# Patient Record
Sex: Male | Born: 1975 | Race: White | Hispanic: No | Marital: Single | Smoking: Current every day smoker
Health system: Southern US, Community
[De-identification: ages and names within clinical notes are randomized; demographics above are authoritative.]

## PROBLEM LIST (undated history)

## (undated) DIAGNOSIS — K219 Gastro-esophageal reflux disease without esophagitis: Secondary | ICD-10-CM

## (undated) DIAGNOSIS — F329 Major depressive disorder, single episode, unspecified: Secondary | ICD-10-CM

## (undated) DIAGNOSIS — F319 Bipolar disorder, unspecified: Secondary | ICD-10-CM

## (undated) DIAGNOSIS — Z87820 Personal history of traumatic brain injury: Secondary | ICD-10-CM

## (undated) DIAGNOSIS — F419 Anxiety disorder, unspecified: Secondary | ICD-10-CM

## (undated) DIAGNOSIS — F32A Depression, unspecified: Secondary | ICD-10-CM

## (undated) DIAGNOSIS — Z973 Presence of spectacles and contact lenses: Secondary | ICD-10-CM

## (undated) DIAGNOSIS — N201 Calculus of ureter: Secondary | ICD-10-CM

## (undated) DIAGNOSIS — R413 Other amnesia: Secondary | ICD-10-CM

## (undated) DIAGNOSIS — R3915 Urgency of urination: Secondary | ICD-10-CM

## (undated) HISTORY — PX: APPENDECTOMY: SHX54

---

## 2016-04-28 ENCOUNTER — Encounter (HOSPITAL_COMMUNITY): Payer: Self-pay

## 2016-04-28 ENCOUNTER — Emergency Department (HOSPITAL_COMMUNITY)
Admission: EM | Admit: 2016-04-28 | Discharge: 2016-04-28 | Disposition: A | Discharge status: Home / Self Care | Payer: Self-pay | Attending: Emergency Medicine | Admitting: Emergency Medicine

## 2016-04-28 ENCOUNTER — Emergency Department (HOSPITAL_COMMUNITY): Payer: Self-pay

## 2016-04-28 DIAGNOSIS — F17200 Nicotine dependence, unspecified, uncomplicated: Secondary | ICD-10-CM | POA: Insufficient documentation

## 2016-04-28 DIAGNOSIS — N132 Hydronephrosis with renal and ureteral calculous obstruction: Secondary | ICD-10-CM | POA: Insufficient documentation

## 2016-04-28 DIAGNOSIS — N23 Unspecified renal colic: Secondary | ICD-10-CM

## 2016-04-28 DIAGNOSIS — N200 Calculus of kidney: Secondary | ICD-10-CM

## 2016-04-28 LAB — URINALYSIS, ROUTINE W REFLEX MICROSCOPIC
BACTERIA UA: NONE SEEN
BILIRUBIN URINE: NEGATIVE
Glucose, UA: NEGATIVE mg/dL
HGB URINE DIPSTICK: NEGATIVE
KETONES UR: NEGATIVE mg/dL
Leukocytes, UA: NEGATIVE
NITRITE: NEGATIVE
PROTEIN: 30 mg/dL — AB
Specific Gravity, Urine: 1.017 (ref 1.005–1.030)
Squamous Epithelial / LPF: NONE SEEN
pH: 9 — ABNORMAL HIGH (ref 5.0–8.0)

## 2016-04-28 LAB — COMPREHENSIVE METABOLIC PANEL
ALBUMIN: 4.3 g/dL (ref 3.5–5.0)
ALK PHOS: 71 U/L (ref 38–126)
ALT: 39 U/L (ref 17–63)
ANION GAP: 14 (ref 5–15)
AST: 42 U/L — ABNORMAL HIGH (ref 15–41)
BUN: 13 mg/dL (ref 6–20)
CALCIUM: 9.9 mg/dL (ref 8.9–10.3)
CO2: 21 mmol/L — AB (ref 22–32)
Chloride: 103 mmol/L (ref 101–111)
Creatinine, Ser: 1.11 mg/dL (ref 0.61–1.24)
GFR calc non Af Amer: >60 mL/min (ref >60)
GLUCOSE: 122 mg/dL — AB (ref 65–99)
POTASSIUM: 4.9 mmol/L (ref 3.5–5.1)
SODIUM: 138 mmol/L (ref 135–145)
TOTAL PROTEIN: 7.7 g/dL (ref 6.5–8.1)
Total Bilirubin: 0.8 mg/dL (ref 0.3–1.2)

## 2016-04-28 LAB — CBC
HEMATOCRIT: 44.1 % (ref 39.0–52.0)
HEMOGLOBIN: 15.2 g/dL (ref 13.0–17.0)
MCH: 31.7 pg (ref 26.0–34.0)
MCHC: 34.5 g/dL (ref 30.0–36.0)
MCV: 91.9 fL (ref 78.0–100.0)
Platelets: 309 10*3/uL (ref 150–400)
RBC: 4.8 MIL/uL (ref 4.22–5.81)
RDW: 13.1 % (ref 11.5–15.5)
WBC: 9.8 10*3/uL (ref 4.0–10.5)

## 2016-04-28 LAB — LIPASE, BLOOD: Lipase: 27 U/L (ref 11–51)

## 2016-04-28 MED ORDER — HYDROMORPHONE HCL 1 MG/ML IJ SOLN
1.0000 mg | Freq: Once | INTRAMUSCULAR | Status: AC
  Administered 2016-04-28: 1 mg via INTRAVENOUS
  Filled 2016-04-28: qty 1

## 2016-04-28 MED ORDER — TAMSULOSIN HCL 0.4 MG PO CAPS
0.4000 mg | ORAL_CAPSULE | Freq: Two times a day (BID) | ORAL | 0 refills | Status: DC
Start: 2016-04-28 — End: 2016-05-16

## 2016-04-28 MED ORDER — FENTANYL CITRATE (PF) 100 MCG/2ML IJ SOLN
100.0000 ug | Freq: Once | INTRAMUSCULAR | Status: AC
Start: 2016-04-28 — End: 2016-04-28
  Administered 2016-04-28: 100 ug via INTRAVENOUS
  Filled 2016-04-28: qty 2

## 2016-04-28 MED ORDER — ONDANSETRON 4 MG PO TBDP
4.0000 mg | ORAL_TABLET | Freq: Once | ORAL | Status: AC | PRN
  Administered 2016-04-28: 4 mg via ORAL

## 2016-04-28 MED ORDER — TAMSULOSIN HCL 0.4 MG PO CAPS
0.4000 mg | ORAL_CAPSULE | Freq: Once | ORAL | Status: AC
  Administered 2016-04-28: 0.4 mg via ORAL
  Filled 2016-04-28: qty 1

## 2016-04-28 MED ORDER — ONDANSETRON HCL 4 MG/2ML IJ SOLN
4.0000 mg | Freq: Once | INTRAMUSCULAR | Status: AC | PRN
  Administered 2016-04-28: 4 mg via INTRAVENOUS
  Filled 2016-04-28: qty 2

## 2016-04-28 MED ORDER — ONDANSETRON HCL 4 MG PO TABS: 4.0000 mg | ORAL_TABLET | Freq: Four times a day (QID) | ORAL | 0 refills | Status: AC | PRN

## 2016-04-28 MED ORDER — OXYCODONE-ACETAMINOPHEN 5-325 MG PO TABS
1.0000 | ORAL_TABLET | Freq: Once | ORAL | Status: AC
  Administered 2016-04-28: 1 via ORAL

## 2016-04-28 MED ORDER — ONDANSETRON 4 MG PO TBDP
ORAL_TABLET | ORAL | Status: AC
  Filled 2016-04-28: qty 1

## 2016-04-28 MED ORDER — KETOROLAC TROMETHAMINE 30 MG/ML IJ SOLN
30.0000 mg | Freq: Once | INTRAMUSCULAR | Status: AC
  Administered 2016-04-28: 30 mg via INTRAVENOUS
  Filled 2016-04-28: qty 1

## 2016-04-28 MED ORDER — KETOROLAC TROMETHAMINE 10 MG PO TABS: 10.0000 mg | ORAL_TABLET | Freq: Four times a day (QID) | ORAL | 0 refills | Status: DC | PRN

## 2016-04-28 MED ORDER — KETOROLAC TROMETHAMINE 10 MG PO TABS
10.0000 mg | ORAL_TABLET | Freq: Once | ORAL | Status: AC
  Administered 2016-04-28: 10 mg via ORAL
  Filled 2016-04-28: qty 1

## 2016-04-28 MED ORDER — OXYCODONE-ACETAMINOPHEN 5-325 MG PO TABS
1.0000 | ORAL_TABLET | ORAL | 0 refills | Status: DC | PRN
Start: 2016-04-28 — End: 2016-05-16

## 2016-04-28 MED ORDER — OXYCODONE-ACETAMINOPHEN 5-325 MG PO TABS
ORAL_TABLET | ORAL | Status: AC
  Filled 2016-04-28: qty 1

## 2016-04-28 NOTE — ED Notes (Signed)
IV removed.

## 2016-04-28 NOTE — ED Triage Notes (Signed)
Patient complains of sudden onset of LLQ pain radiating to flank for 45 minutes prior to arrival with nausea. Patient restless and hyperventilating on arrival. No vomiting, no diarrhea. Alert and oriented

## 2016-04-28 NOTE — ED Notes (Signed)
Pt states he understands instructions. Home stable with steady gait with wife. 

## 2016-04-28 NOTE — ED Provider Notes (Signed)
[facility]-EMERGENCY DEPT Provider Note   CSN: [id number] Arrival date & time: 04/28/16  0705     History   Chief Complaint Chief Complaint  Patient presents with  . Abdominal Pain  . Flank Pain    HPI KHADEEM ROCKETT is a 41 y.o. male with no significant past medical history who presents emergency Department with chief complaint of left flank pain. Patient states that he awoke around 4:30 in the morning with left flank pain. Pain is described as colicky, "the worst pain of ever had in my life." He states that he has a constant resting pain which is severe, however, he has bouts of intolerable left flank pain which she describes as a severe cramp, radiating from the left part of his back all the way around to the left lower quadrant of his abdomen. He denies any testicular pain. He did have increased urinary frequency yesterday but denies hematuria. He has no previous history of kidney stones but does have a family history significant for both a father and sister who get them. He states "I think I might have a kidney stone." He has been feeling well up until this morning. He has not had any fevers, chills. He is not an IV drug user. He denies constipation, diarrhea, testicular pain, discharge from the penis or lesions.  HPI  History reviewed. No pertinent past medical history.  There are no active problems to display for this patient.   History reviewed. No pertinent surgical history.     Home Medications    Prior to Admission medications   Not on File    Family History No family history on file.  Social History Social History  Substance Use Topics  . Smoking status: Current Every Day Smoker  . Smokeless tobacco: Never Used  . Alcohol use Not on file     Allergies   Patient has no known allergies.   Review of Systems Review of Systems  Ten systems reviewed and are negative for acute change, except as noted in the HPI.   Physical Exam Updated Vital Signs BP (!)  148/101   Pulse 70   Resp (!) 24   SpO2 100%   Physical Exam  Constitutional: He appears well-developed and well-nourished. No distress.  Patient appears uncomfortable. Unable to find position of comfort on the bed   HENT:  Head: Normocephalic and atraumatic.  Eyes: Conjunctivae are normal. No scleral icterus.  Neck: Normal range of motion. Neck supple.  Cardiovascular: Normal rate, regular rhythm and normal heart sounds.   Pulmonary/Chest: Effort normal and breath sounds normal. No respiratory distress.  Abdominal: Soft. There is no tenderness.  NO CVA tenderness No abdominal tenderness  Musculoskeletal: He exhibits no edema.  Neurological: He is alert.  Skin: Skin is warm and dry. He is not diaphoretic.  Psychiatric: His behavior is normal.  Nursing note and vitals reviewed.    ED Treatments / Results  Labs (all labs ordered are listed, but only abnormal results are displayed) Labs Reviewed  COMPREHENSIVE METABOLIC PANEL - Abnormal; Notable for the following:       Result Value   CO2 21 (*)    Glucose, Bld 122 (*)    AST 42 (*)    All other components within normal limits  URINALYSIS, ROUTINE W REFLEX MICROSCOPIC - Abnormal; Notable for the following:    pH 9.0 (*)    Protein, ur 30 (*)    All other components within normal limits  LIPASE, BLOOD  CBC  EKG  EKG Interpretation None       Radiology No results found.  Procedures Procedures (including critical care time)  Medications Ordered in ED Medications  ketorolac (TORADOL) 30 MG/ML injection 30 mg (not administered)  ondansetron (ZOFRAN-ODT) disintegrating tablet 4 mg (4 mg Oral Given 04/28/16 0724)  oxyCODONE-acetaminophen (PERCOCET/ROXICET) 5-325 MG per tablet 1 tablet (1 tablet Oral Given 04/28/16 0724)  ondansetron (ZOFRAN) injection 4 mg (4 mg Intravenous Given 04/28/16 0834)  fentaNYL (SUBLIMAZE) injection 100 mcg (100 mcg Intravenous Given 04/28/16 0856)     Initial Impression / Assessment  and Plan / ED Course  I have reviewed the triage vital signs and the nursing notes.  Pertinent labs & imaging results that were available during my care of the patient were reviewed by me and considered in my medical decision making (see chart for details).     Patient with severe colicky flank pain. High suspicion for renal colic, although the patient does not have hematuria, crystals or white cells in his urine. I doubt so, as abscess, diverticulitis or other infectious sources. He does not have fever, the tachycardia, or elevated white blood cell count. I have ordered a CT scan of the abdomen without contrast for evaluation of potential kidney stone. Patient given fentanyl and Toradol with improvement in his pain   Patient with a proximal left. Ureteral stone. He does have some left-sided hydronephrosis and fluid backing up into the left kidney. His knee and causing expansion of the renal capsule. Patient's pain is greatly improved and he would like to try to go home with outpatient medications. The Toradol was extremely helpful with his pain. He is not actively vomiting. I discussed return precautions with the patient and follow-up. The patient appears safe for discharge at this time. He has no signs of infection and is able to tolerate by mouth fluids at this time  Final Clinical Impressions(s) / ED Diagnoses   Final diagnoses:  None    New Prescriptions New Prescriptions   No medications on file     Arthor Captain, PA-C 04/28/16 1657    Canary Brim Tegeler, MD 04/29/16 2129

## 2016-04-28 NOTE — ED Notes (Signed)
Pt states he vomited pain and nausea meds at triage.

## 2016-04-28 NOTE — ED Notes (Signed)
SR up x 2. Pt crying and moving with pain. male at bedside

## 2016-04-28 NOTE — ED Notes (Signed)
Patient transported to CT 

## 2016-04-28 NOTE — Discharge Instructions (Signed)
Please go to [facility] long ER for fever, uncontrolled pain or vomiting, or other concerns.

## 2016-04-28 NOTE — ED Notes (Addendum)
Patient has vomited in lobby, assuming pain medication unable to keep down.

## 2016-05-01 ENCOUNTER — Encounter (HOSPITAL_COMMUNITY): Payer: Self-pay | Admitting: Emergency Medicine

## 2016-05-01 ENCOUNTER — Emergency Department (HOSPITAL_COMMUNITY): Payer: Self-pay

## 2016-05-01 ENCOUNTER — Emergency Department (HOSPITAL_COMMUNITY)
Admission: EM | Admit: 2016-05-01 | Discharge: 2016-05-02 | Disposition: A | Discharge status: Home / Self Care | Payer: Self-pay | Attending: Physician Assistant | Admitting: Physician Assistant

## 2016-05-01 DIAGNOSIS — F17200 Nicotine dependence, unspecified, uncomplicated: Secondary | ICD-10-CM | POA: Insufficient documentation

## 2016-05-01 DIAGNOSIS — N200 Calculus of kidney: Secondary | ICD-10-CM

## 2016-05-01 DIAGNOSIS — N132 Hydronephrosis with renal and ureteral calculous obstruction: Secondary | ICD-10-CM | POA: Insufficient documentation

## 2016-05-01 DIAGNOSIS — Z79899 Other long term (current) drug therapy: Secondary | ICD-10-CM | POA: Insufficient documentation

## 2016-05-01 LAB — COMPREHENSIVE METABOLIC PANEL
ALBUMIN: 3.5 g/dL (ref 3.5–5.0)
ALK PHOS: 63 U/L (ref 38–126)
ALT: 27 U/L (ref 17–63)
AST: 26 U/L (ref 15–41)
Anion gap: 8 (ref 5–15)
BILIRUBIN TOTAL: 0.5 mg/dL (ref 0.3–1.2)
BUN: 20 mg/dL (ref 6–20)
CALCIUM: 8.7 mg/dL — AB (ref 8.9–10.3)
CO2: 24 mmol/L (ref 22–32)
Chloride: 105 mmol/L (ref 101–111)
Creatinine, Ser: 2.13 mg/dL — ABNORMAL HIGH (ref 0.61–1.24)
GFR calc Af Amer: 43 mL/min — ABNORMAL LOW (ref >60)
GFR calc non Af Amer: 37 mL/min — ABNORMAL LOW (ref >60)
GLUCOSE: 116 mg/dL — AB (ref 65–99)
Potassium: 3.8 mmol/L (ref 3.5–5.1)
SODIUM: 137 mmol/L (ref 135–145)
TOTAL PROTEIN: 6.8 g/dL (ref 6.5–8.1)

## 2016-05-01 LAB — CBC WITH DIFFERENTIAL/PLATELET
BASOS ABS: 0 10*3/uL (ref 0.0–0.1)
BASOS PCT: 0 %
EOS PCT: 4 %
Eosinophils Absolute: 0.6 10*3/uL (ref 0.0–0.7)
HEMATOCRIT: 37.4 % — AB (ref 39.0–52.0)
Hemoglobin: 12.4 g/dL — ABNORMAL LOW (ref 13.0–17.0)
LYMPHS PCT: 10 %
Lymphs Abs: 1.5 10*3/uL (ref 0.7–4.0)
MCH: 30.8 pg (ref 26.0–34.0)
MCHC: 33.2 g/dL (ref 30.0–36.0)
MCV: 93 fL (ref 78.0–100.0)
MONO ABS: 1.2 10*3/uL — AB (ref 0.1–1.0)
Monocytes Relative: 8 %
Neutro Abs: 11.4 10*3/uL — ABNORMAL HIGH (ref 1.7–7.7)
Neutrophils Relative %: 78 %
Platelets: 261 10*3/uL (ref 150–400)
RBC: 4.02 MIL/uL — ABNORMAL LOW (ref 4.22–5.81)
RDW: 13.2 % (ref 11.5–15.5)
WBC: 14.7 10*3/uL — ABNORMAL HIGH (ref 4.0–10.5)

## 2016-05-01 LAB — URINALYSIS, ROUTINE W REFLEX MICROSCOPIC
Bilirubin Urine: NEGATIVE
Glucose, UA: NEGATIVE mg/dL
Hgb urine dipstick: NEGATIVE
Ketones, ur: NEGATIVE mg/dL
LEUKOCYTES UA: NEGATIVE
NITRITE: NEGATIVE
PH: 7 (ref 5.0–8.0)
Protein, ur: NEGATIVE mg/dL
SPECIFIC GRAVITY, URINE: 1.017 (ref 1.005–1.030)

## 2016-05-01 MED ORDER — SODIUM CHLORIDE 0.9 % IV BOLUS (SEPSIS)
1000.0000 mL | Freq: Once | INTRAVENOUS | Status: AC
  Administered 2016-05-01: 1000 mL via INTRAVENOUS

## 2016-05-01 MED ORDER — OXYCODONE-ACETAMINOPHEN 5-325 MG PO TABS
1.0000 | ORAL_TABLET | Freq: Once | ORAL | Status: AC
  Administered 2016-05-01: 1 via ORAL
  Filled 2016-05-01: qty 1

## 2016-05-01 MED ORDER — KETOROLAC TROMETHAMINE 30 MG/ML IJ SOLN
30.0000 mg | Freq: Once | INTRAMUSCULAR | Status: AC
  Administered 2016-05-01: 30 mg via INTRAVENOUS
  Filled 2016-05-01: qty 1

## 2016-05-01 MED ORDER — KETOROLAC TROMETHAMINE 10 MG PO TABS: 10.0000 mg | ORAL_TABLET | Freq: Four times a day (QID) | ORAL | 0 refills | Status: AC | PRN

## 2016-05-01 MED ORDER — OXYCODONE-ACETAMINOPHEN 5-325 MG PO TABS
1.0000 | ORAL_TABLET | Freq: Once | ORAL | Status: AC
  Administered 2016-05-02: 1 via ORAL
  Filled 2016-05-01: qty 1

## 2016-05-01 MED ORDER — ONDANSETRON HCL 4 MG/2ML IJ SOLN
4.0000 mg | Freq: Once | INTRAMUSCULAR | Status: AC
  Administered 2016-05-01: 4 mg via INTRAVENOUS
  Filled 2016-05-01: qty 2

## 2016-05-01 MED ORDER — TAMSULOSIN HCL 0.4 MG PO CAPS: 0.4000 mg | ORAL_CAPSULE | Freq: Every day | ORAL | 0 refills | Status: AC

## 2016-05-01 MED ORDER — OXYCODONE-ACETAMINOPHEN 5-325 MG PO TABS: 1.0000 | ORAL_TABLET | Freq: Four times a day (QID) | ORAL | 0 refills | Status: DC | PRN

## 2016-05-01 NOTE — ED Triage Notes (Signed)
Patient c/o left sided flank pain since Saturday. Reports he was seen at [facility] on Saturday and diagnosed with kidney stone. Reports running out of percocet this morning but until that time percocet and tramadol were controlling his pain.

## 2016-05-01 NOTE — ED Provider Notes (Signed)
[facility]-EMERGENCY DEPT Provider Note   CSN: [id number] Arrival date & time: 05/01/16  1946     History   Chief Complaint Chief Complaint  Patient presents with  . Nephrolithiasis    HPI Don Pham is a 41 y.o. male.  HPI   Patient is a 41 year old male presenting with kidney stone. Patient was diagnosed with kidney stone 4 days ago 5 mm x 4 mm. He was given Toradol, tramadol and Percocet to go home with as well as Flomax. Patient reports feeling prescriptions in taking them. He reports it seems well-controlled on these prescriptions but then when his prescription ran out he began to have increasing pain today prior to arrival. He's had mild nausea. The pain is getting more severe. Reports chills and fevers, none recorded  History reviewed. No pertinent past medical history.  There are no active problems to display for this patient.   History reviewed. No pertinent surgical history.     Home Medications    Prior to Admission medications   Medication Sig Start Date End Date Taking? Authorizing Provider  buPROPion (WELLBUTRIN) 100 MG tablet Take 150 mg by mouth 2 (two) times daily.    Yes Historical Provider, MD  gabapentin (NEURONTIN) 300 MG capsule Take 300 mg by mouth 2 (two) times daily.   Yes Historical Provider, MD  ibuprofen (ADVIL,MOTRIN) 200 MG tablet Take 400-800 mg by mouth every 6 (six) hours as needed for mild pain.   Yes Historical Provider, MD  ketorolac (TORADOL) 10 MG tablet Take 1 tablet (10 mg total) by mouth every 6 (six) hours as needed. Patient taking differently: Take 10 mg by mouth every 6 (six) hours as needed for moderate pain or severe pain.  04/28/16  Yes Arthor Captain, PA-C  ondansetron (ZOFRAN) 4 MG tablet Take 1 tablet (4 mg total) by mouth 4 (four) times daily as needed for nausea or vomiting. 04/28/16  Yes Arthor Captain, PA-C  oxyCODONE-acetaminophen (PERCOCET) 5-325 MG tablet Take 1-2 tablets by mouth every 4 (four) hours as needed. Patient  taking differently: Take 1-2 tablets by mouth every 4 (four) hours as needed for moderate pain or severe pain.  04/28/16  Yes Arthor Captain, PA-C  QUEtiapine (SEROQUEL) 100 MG tablet Take 100 mg by mouth at bedtime.   Yes Historical Provider, MD  tamsulosin (FLOMAX) 0.4 MG CAPS capsule Take 1 capsule (0.4 mg total) by mouth 2 (two) times daily. 04/28/16  Yes Arthor Captain, PA-C  ketorolac (TORADOL) 10 MG tablet Take 1 tablet (10 mg total) by mouth every 6 (six) hours as needed. 05/01/16   Yarely Bebee Lyn Jovana Rembold, MD  oxyCODONE-acetaminophen (PERCOCET/ROXICET) 5-325 MG tablet Take 1 tablet by mouth every 6 (six) hours as needed for severe pain. 05/01/16   Angelus Hoopes Lyn Aadil Sur, MD  tamsulosin (FLOMAX) 0.4 MG CAPS capsule Take 1 capsule (0.4 mg total) by mouth daily. 05/01/16   Tarvis Blossom Lyn Alias Villagran, MD    Family History History reviewed. No pertinent family history.  Social History Social History  Substance Use Topics  . Smoking status: Current Every Day Smoker  . Smokeless tobacco: Never Used  . Alcohol use Not on file     Allergies   Patient has no known allergies.   Review of Systems Review of Systems  Constitutional: Negative for activity change.  Respiratory: Negative for shortness of breath.   Cardiovascular: Negative for chest pain.  Gastrointestinal: Positive for abdominal pain.  Genitourinary: Positive for flank pain. Negative for difficulty urinating, dysuria and testicular pain.  All other systems reviewed and are negative.    Physical Exam Updated Vital Signs BP 136/90 (BP Location: Left Arm)   Pulse 98   Temp 98.1 F (36.7 C) (Oral)   Resp 20   SpO2 98%   Physical Exam  Constitutional: He is oriented to person, place, and time. He appears well-nourished.  Pale, in pain  HENT:  Head: Normocephalic.  Eyes: Conjunctivae are normal.  Cardiovascular: Normal rate and regular rhythm.   Pulmonary/Chest: Effort normal and breath sounds normal.  Abdominal: Soft. He  exhibits no distension.  Neurological: He is oriented to person, place, and time.  Skin: Skin is warm and dry. He is not diaphoretic.  Psychiatric: He has a normal mood and affect. His behavior is normal.     ED Treatments / Results  Labs (all labs ordered are listed, but only abnormal results are displayed) Labs Reviewed  COMPREHENSIVE METABOLIC PANEL - Abnormal; Notable for the following:       Result Value   Glucose, Bld 116 (*)    Creatinine, Ser 2.13 (*)    Calcium 8.7 (*)    GFR calc non Af Amer 37 (*)    GFR calc Af Amer 43 (*)    All other components within normal limits  CBC WITH DIFFERENTIAL/PLATELET - Abnormal; Notable for the following:    WBC 14.7 (*)    RBC 4.02 (*)    Hemoglobin 12.4 (*)    HCT 37.4 (*)    Neutro Abs 11.4 (*)    Monocytes Absolute 1.2 (*)    All other components within normal limits  URINALYSIS, ROUTINE W REFLEX MICROSCOPIC    EKG  EKG Interpretation None       Radiology Ct Renal Stone Study  Result Date: 05/01/2016 CLINICAL DATA:  41 year old male with left-sided flank pain. EXAM: CT ABDOMEN AND PELVIS WITHOUT CONTRAST TECHNIQUE: Multidetector CT imaging of the abdomen and pelvis was performed following the standard protocol without IV contrast. COMPARISON:  Abdominal CT dated 04/28/2016 FINDINGS: Evaluation of this exam is limited in the absence of intravenous contrast. Lower chest: The visualized lung bases are clear. No intra-abdominal free air or free fluid. Hepatobiliary: No focal liver abnormality is seen. No gallstones, gallbladder wall thickening, or biliary dilatation. Pancreas: Unremarkable. No pancreatic ductal dilatation or surrounding inflammatory changes. Spleen: Normal in size without focal abnormality. Adrenals/Urinary Tract: The adrenal glands are unremarkable. There is a 5 mm stone in the mid left ureter with mild left hydronephrosis. There is no significant interval change in the location of the stone compared to the prior  CT. Stable or minimally increased hydronephrosis compared to the prior CT. The no other stone identified. The right kidney is unremarkable. The urinary bladder is partially distended appears unremarkable. Stomach/Bowel: There is moderate amount of stool throughout the colon. No evidence of bowel obstruction or active inflammation. The appendix is not identified with certainty. A small tubular structure inferior to the cecum may represent a normal appendix. There is no inflammatory changes or secondary signs of acute appendicitis. Vascular/Lymphatic: There is mild aortoiliac atherosclerotic disease. The abdominal aorta and IVC are otherwise grossly unremarkable on this noncontrast study. No portal venous gas identified. There is no adenopathy. Reproductive: The prostate and seminal vesicles are grossly unremarkable. Other: No abdominal wall hernia or abnormality. No abdominopelvic ascites.None Musculoskeletal: No acute or significant osseous findings. IMPRESSION: 1. A 5 mm stone in the left mid ureter with mild left hydronephrosis. No interval change in the location of the stone  since the prior CT. Stable or minimally increased left hydronephrosis. Correlation with urinalysis recommended to exclude superimposed UTI. No other acute intra-abdominopelvic pathology identified. 2. Moderate colonic stool burden. No evidence of bowel obstruction or active inflammation. 3. Aortic atherosclerosis. Electronically Signed   By: Elgie Collard M.D.   On: 05/01/2016 23:14    Procedures Procedures (including critical care time)  Medications Ordered in ED Medications  oxyCODONE-acetaminophen (PERCOCET/ROXICET) 5-325 MG per tablet 1 tablet (not administered)  ketorolac (TORADOL) 30 MG/ML injection 30 mg (30 mg Intravenous Given 05/01/16 2235)  oxyCODONE-acetaminophen (PERCOCET/ROXICET) 5-325 MG per tablet 1 tablet (1 tablet Oral Given 05/01/16 2235)  sodium chloride 0.9 % bolus 1,000 mL (1,000 mLs Intravenous New Bag/Given  05/01/16 2235)  ondansetron (ZOFRAN) injection 4 mg (4 mg Intravenous Given 05/01/16 2235)     Initial Impression / Assessment and Plan / ED Course  I have reviewed the triage vital signs and the nursing notes.  Pertinent labs & imaging results that were available during my care of the patient were reviewed by me and considered in my medical decision making (see chart for details).     Patient is a 41 year old male presenting with left-sided nephrolithiasis. Patient ran out of his medications and the pain got acutely worse. Patient was diagnosed 4 days ago.He feels the pain is getting worse.5X4 unmoved from prior.   Patient does have elevation in his creatinine. Patient's creatinine has doubled since 4 days ago. Patient also has white count of 14.   11:55 PM I discussed this with Dr. Kathrynn Running of urology. Discussed with the patient who feels very comfortable now. Patient reports that he feels safe going home because he is comfortable. Making urine, normal vitals. Patient just needs a refill on his Percocet prescription. Dr.Manning  has reviewed labs and imaging and feels comfortable with follow-up tomorrow morning.  Final Clinical Impressions(s) / ED Diagnoses   Final diagnoses:  Nephrolithiasis    New Prescriptions New Prescriptions   KETOROLAC (TORADOL) 10 MG TABLET    Take 1 tablet (10 mg total) by mouth every 6 (six) hours as needed.   OXYCODONE-ACETAMINOPHEN (PERCOCET/ROXICET) 5-325 MG TABLET    Take 1 tablet by mouth every 6 (six) hours as needed for severe pain.   TAMSULOSIN (FLOMAX) 0.4 MG CAPS CAPSULE    Take 1 capsule (0.4 mg total) by mouth daily.     Monie Shere Randall An, MD 05/01/16 2356

## 2016-05-01 NOTE — Discharge Instructions (Signed)
You were seen today with a kidney stone. Your  kidney function is mildly worsening and the kidney stone is stuck. However we talked to urology and they feel comfortable seeing you tomorrow morning. Please call immediately in the morning and follow-up.  Return with any concerns.

## 2016-05-02 ENCOUNTER — Encounter (HOSPITAL_COMMUNITY): Payer: Self-pay | Admitting: Emergency Medicine

## 2016-05-02 DIAGNOSIS — Z5321 Procedure and treatment not carried out due to patient leaving prior to being seen by health care provider: Secondary | ICD-10-CM | POA: Insufficient documentation

## 2016-05-02 DIAGNOSIS — K5900 Constipation, unspecified: Secondary | ICD-10-CM | POA: Insufficient documentation

## 2016-05-02 NOTE — ED Triage Notes (Signed)
Patient c/o constipation due to pain medications for kidney stone. Pt states no BM since Friday. Feels as if abdomen is hard and distended.

## 2016-05-03 ENCOUNTER — Emergency Department (HOSPITAL_COMMUNITY)
Admission: EM | Admit: 2016-05-03 | Discharge: 2016-05-03 | Disposition: A | Discharge status: Home / Self Care | Payer: Self-pay | Attending: Dermatology | Admitting: Dermatology

## 2016-05-03 NOTE — ED Notes (Signed)
Pt reported to registration that he was feeling better and was going to leave 

## 2016-05-07 ENCOUNTER — Other Ambulatory Visit: Payer: Self-pay | Admitting: Urology

## 2016-05-09 ENCOUNTER — Other Ambulatory Visit: Payer: Self-pay | Admitting: Urology

## 2016-05-09 NOTE — Patient Instructions (Signed)
Don Pham  05/09/2016   Your procedure is scheduled on: 05/18/16  Report to [facility] Main  Entrance            Take [place]  elevators to 3rd floor to  Short Stay Center at       [phone].    Call this number if you have problems the morning of surgery [phone]    Remember: ONLY 1 PERSON MAY GO WITH YOU TO SHORT STAY TO GET  READY MORNING OF YOUR SURGERY.  Do not eat food or drink liquids :After Midnight.     Take these medicines the morning of surgery with A SIP OF WATER: tamsulosin , oxycodone, gabapentin, bupropion                                You may not have any metal on your body including hair pins and              piercings  Do not wear jewelry,  lotions, powders or perfumes, deodorant                       Men may shave face and neck.   Do not bring valuables to the hospital. [place] IS NOT             RESPONSIBLE   FOR VALUABLES.  Contacts, dentures or bridgework may not be worn into surgery.      Patients discharged the day of surgery will not be allowed to drive home.  Name and phone number of your driver:  Special Instructions: N/A              Please read over the following fact sheets you were given: _____________________________________________________________________             [facility] - Preparing for Surgery Before surgery, you can play an important role.  Because skin is not sterile, your skin needs to be as free of germs as possible.  You can reduce the number of germs on your skin by washing with CHG (chlorahexidine gluconate) soap before surgery.  CHG is an antiseptic cleaner which kills germs and bonds with the skin to continue killing germs even after washing. Please DO NOT use if you have an allergy to CHG or antibacterial soaps.  If your skin becomes reddened/irritated stop using the CHG and inform your nurse when you arrive at Short Stay. Do not shave (including legs and underarms) for at least 48 hours prior  to the first CHG shower.  You may shave your face/neck. Please follow these instructions carefully:  1.  Shower with CHG Soap the night before surgery and the  morning of Surgery.  2.  If you choose to wash your hair, wash your hair first as usual with your  normal  shampoo.  3.  After you shampoo, rinse your hair and body thoroughly to remove the  shampoo.                           4.  Use CHG as you would any other liquid soap.  You can apply chg directly  to the skin and wash  Gently with a scrungie or clean washcloth.  5.  Apply the CHG Soap to your body ONLY FROM THE NECK DOWN.   Do not use on face/ open                           Wound or open sores. Avoid contact with eyes, ears mouth and genitals (private parts).                       Wash face,  Genitals (private parts) with your normal soap.             6.  Wash thoroughly, paying special attention to the area where your surgery  will be performed.  7.  Thoroughly rinse your body with warm water from the neck down.  8.  DO NOT shower/wash with your normal soap after using and rinsing off  the CHG Soap.                9.  Pat yourself dry with a clean towel.            10.  Wear clean pajamas.            11.  Place clean sheets on your bed the night of your first shower and do not  sleep with pets. Day of Surgery : Do not apply any lotions/deodorants the morning of surgery.  Please wear clean clothes to the hospital/surgery center.  FAILURE TO FOLLOW THESE INSTRUCTIONS MAY RESULT IN THE CANCELLATION OF YOUR SURGERY PATIENT SIGNATURE_________________________________  NURSE SIGNATURE__________________________________  ________________________________________________________________________

## 2016-05-11 ENCOUNTER — Encounter (HOSPITAL_COMMUNITY): Payer: Self-pay

## 2016-05-11 ENCOUNTER — Encounter (HOSPITAL_COMMUNITY)
Admission: RE | Admit: 2016-05-11 | Discharge: 2016-05-11 | Disposition: A | Discharge status: Home / Self Care | Payer: Self-pay | Source: Ambulatory Visit | Attending: Urology | Admitting: Urology

## 2016-05-11 DIAGNOSIS — Z01812 Encounter for preprocedural laboratory examination: Secondary | ICD-10-CM | POA: Insufficient documentation

## 2016-05-11 DIAGNOSIS — N201 Calculus of ureter: Secondary | ICD-10-CM | POA: Insufficient documentation

## 2016-05-11 HISTORY — DX: Anxiety disorder, unspecified: F41.9

## 2016-05-11 HISTORY — DX: Bipolar disorder, unspecified: F31.9

## 2016-05-11 HISTORY — DX: Depression, unspecified: F32.A

## 2016-05-11 HISTORY — DX: Major depressive disorder, single episode, unspecified: F32.9

## 2016-05-11 LAB — CBC
HEMATOCRIT: 39.3 % (ref 39.0–52.0)
HEMOGLOBIN: 13.4 g/dL (ref 13.0–17.0)
MCH: 31.6 pg (ref 26.0–34.0)
MCHC: 34.1 g/dL (ref 30.0–36.0)
MCV: 92.7 fL (ref 78.0–100.0)
PLATELETS: 388 10*3/uL (ref 150–400)
RBC: 4.24 MIL/uL (ref 4.22–5.81)
RDW: 13.1 % (ref 11.5–15.5)
WBC: 9.5 10*3/uL (ref 4.0–10.5)

## 2016-05-11 LAB — BASIC METABOLIC PANEL
ANION GAP: 8 (ref 5–15)
BUN: 14 mg/dL (ref 6–20)
CHLORIDE: 105 mmol/L (ref 101–111)
CO2: 25 mmol/L (ref 22–32)
CREATININE: 1.1 mg/dL (ref 0.61–1.24)
Calcium: 9.6 mg/dL (ref 8.9–10.3)
GFR calc non Af Amer: >60 mL/min (ref >60)
Glucose, Bld: 109 mg/dL — ABNORMAL HIGH (ref 65–99)
POTASSIUM: 4.4 mmol/L (ref 3.5–5.1)
SODIUM: 138 mmol/L (ref 135–145)

## 2016-05-17 ENCOUNTER — Encounter (HOSPITAL_BASED_OUTPATIENT_CLINIC_OR_DEPARTMENT_OTHER): Payer: Self-pay | Admitting: *Deleted

## 2016-05-17 ENCOUNTER — Other Ambulatory Visit: Payer: Self-pay | Admitting: Urology

## 2016-05-18 ENCOUNTER — Encounter (HOSPITAL_BASED_OUTPATIENT_CLINIC_OR_DEPARTMENT_OTHER): Payer: Self-pay | Admitting: *Deleted

## 2016-05-18 NOTE — Progress Notes (Signed)
NPO AFTER MN.  ARRIVE AT 0700.  CURRENT LAB RESULTS IN CHART AND EPIC.  WILL TAKE AM MEDS DOS W/ SIPS OF WATER AND IF NEEDED TAKE PAIN / NAUSEA MEDS.

## 2016-05-23 ENCOUNTER — Ambulatory Visit (HOSPITAL_BASED_OUTPATIENT_CLINIC_OR_DEPARTMENT_OTHER)
Admission: RE | Admit: 2016-05-23 | Discharge: 2016-05-23 | Disposition: A | Discharge status: Home / Self Care | Payer: Self-pay | Source: Ambulatory Visit | Attending: Urology | Admitting: Urology

## 2016-05-23 ENCOUNTER — Ambulatory Visit (HOSPITAL_BASED_OUTPATIENT_CLINIC_OR_DEPARTMENT_OTHER): Payer: Self-pay | Admitting: Anesthesiology

## 2016-05-23 ENCOUNTER — Encounter (HOSPITAL_BASED_OUTPATIENT_CLINIC_OR_DEPARTMENT_OTHER)
Admission: RE | Disposition: A | Discharge status: Home / Self Care | Payer: Self-pay | Source: Ambulatory Visit | Attending: Urology

## 2016-05-23 ENCOUNTER — Encounter (HOSPITAL_BASED_OUTPATIENT_CLINIC_OR_DEPARTMENT_OTHER): Payer: Self-pay | Admitting: *Deleted

## 2016-05-23 DIAGNOSIS — Z79899 Other long term (current) drug therapy: Secondary | ICD-10-CM | POA: Insufficient documentation

## 2016-05-23 DIAGNOSIS — F329 Major depressive disorder, single episode, unspecified: Secondary | ICD-10-CM | POA: Insufficient documentation

## 2016-05-23 DIAGNOSIS — F17200 Nicotine dependence, unspecified, uncomplicated: Secondary | ICD-10-CM | POA: Insufficient documentation

## 2016-05-23 DIAGNOSIS — F419 Anxiety disorder, unspecified: Secondary | ICD-10-CM | POA: Insufficient documentation

## 2016-05-23 DIAGNOSIS — N201 Calculus of ureter: Secondary | ICD-10-CM

## 2016-05-23 DIAGNOSIS — K219 Gastro-esophageal reflux disease without esophagitis: Secondary | ICD-10-CM | POA: Insufficient documentation

## 2016-05-23 HISTORY — PX: HOLMIUM LASER APPLICATION: SHX5852

## 2016-05-23 HISTORY — PX: CYSTOSCOPY WITH RETROGRADE PYELOGRAM, URETEROSCOPY AND STENT PLACEMENT: SHX5789

## 2016-05-23 HISTORY — DX: Personal history of traumatic brain injury: Z87.820

## 2016-05-23 HISTORY — DX: Calculus of ureter: N20.1

## 2016-05-23 HISTORY — DX: Gastro-esophageal reflux disease without esophagitis: K21.9

## 2016-05-23 HISTORY — DX: Presence of spectacles and contact lenses: Z97.3

## 2016-05-23 HISTORY — DX: Other amnesia: R41.3

## 2016-05-23 HISTORY — DX: Urgency of urination: R39.15

## 2016-05-23 SURGERY — CYSTOURETEROSCOPY, WITH RETROGRADE PYELOGRAM AND STENT INSERTION
Anesthesia: General | Site: Bladder | Laterality: Left

## 2016-05-23 MED ORDER — PROPOFOL 10 MG/ML IV BOLUS
INTRAVENOUS | Status: AC
  Filled 2016-05-23: qty 40

## 2016-05-23 MED ORDER — ONDANSETRON HCL 4 MG/2ML IJ SOLN
INTRAMUSCULAR | Status: AC
  Filled 2016-05-23: qty 2

## 2016-05-23 MED ORDER — KETOROLAC TROMETHAMINE 30 MG/ML IJ SOLN
INTRAMUSCULAR | Status: AC
  Filled 2016-05-23: qty 1

## 2016-05-23 MED ORDER — PHENAZOPYRIDINE HCL 100 MG PO TABS
ORAL_TABLET | ORAL | Status: AC
  Filled 2016-05-23: qty 2

## 2016-05-23 MED ORDER — PHENAZOPYRIDINE HCL 200 MG PO TABS: 200.0000 mg | ORAL_TABLET | Freq: Three times a day (TID) | ORAL | 0 refills | Status: AC | PRN

## 2016-05-23 MED ORDER — PROPOFOL 10 MG/ML IV BOLUS
INTRAVENOUS | Status: DC | PRN
  Administered 2016-05-23: 200 mg via INTRAVENOUS

## 2016-05-23 MED ORDER — PHENAZOPYRIDINE HCL 200 MG PO TABS
200.0000 mg | ORAL_TABLET | Freq: Three times a day (TID) | ORAL | Status: DC
  Administered 2016-05-23: 200 mg via ORAL
  Filled 2016-05-23: qty 1

## 2016-05-23 MED ORDER — PROMETHAZINE HCL 25 MG/ML IJ SOLN
6.2500 mg | INTRAMUSCULAR | Status: DC | PRN
  Filled 2016-05-23: qty 1

## 2016-05-23 MED ORDER — IOHEXOL 300 MG/ML  SOLN
INTRAMUSCULAR | Status: DC | PRN
  Administered 2016-05-23: 1 mL via INTRAVENOUS

## 2016-05-23 MED ORDER — OXYCODONE HCL 5 MG/5ML PO SOLN
5.0000 mg | Freq: Once | ORAL | Status: AC | PRN
  Filled 2016-05-23: qty 5

## 2016-05-23 MED ORDER — OXYCODONE HCL 5 MG PO TABS
ORAL_TABLET | ORAL | Status: AC
  Filled 2016-05-23: qty 1

## 2016-05-23 MED ORDER — LIDOCAINE HCL 2 % EX GEL
CUTANEOUS | Status: AC
  Filled 2016-05-23: qty 5

## 2016-05-23 MED ORDER — MEPERIDINE HCL 25 MG/ML IJ SOLN
6.2500 mg | INTRAMUSCULAR | Status: DC | PRN
  Filled 2016-05-23: qty 1

## 2016-05-23 MED ORDER — CIPROFLOXACIN HCL 500 MG PO TABS: 500.0000 mg | ORAL_TABLET | Freq: Once | ORAL | 0 refills | Status: AC

## 2016-05-23 MED ORDER — LIDOCAINE HCL (CARDIAC) 20 MG/ML IV SOLN
INTRAVENOUS | Status: DC | PRN
  Administered 2016-05-23: 100 mg via INTRAVENOUS

## 2016-05-23 MED ORDER — SODIUM CHLORIDE 0.9 % IR SOLN
Status: DC | PRN
  Administered 2016-05-23: 3000 mL via INTRAVESICAL

## 2016-05-23 MED ORDER — MIDAZOLAM HCL 5 MG/5ML IJ SOLN
INTRAMUSCULAR | Status: DC | PRN
  Administered 2016-05-23: 2 mg via INTRAVENOUS

## 2016-05-23 MED ORDER — FENTANYL CITRATE (PF) 100 MCG/2ML IJ SOLN
INTRAMUSCULAR | Status: AC
  Filled 2016-05-23: qty 4

## 2016-05-23 MED ORDER — LACTATED RINGERS IV SOLN
INTRAVENOUS | Status: DC
  Administered 2016-05-23 (×2): via INTRAVENOUS
  Filled 2016-05-23: qty 1000

## 2016-05-23 MED ORDER — HYDROMORPHONE HCL 1 MG/ML IJ SOLN
0.2500 mg | INTRAMUSCULAR | Status: DC | PRN
  Filled 2016-05-23: qty 0.5

## 2016-05-23 MED ORDER — CEFAZOLIN SODIUM-DEXTROSE 2-4 GM/100ML-% IV SOLN
INTRAVENOUS | Status: AC
  Filled 2016-05-23: qty 100

## 2016-05-23 MED ORDER — TRAMADOL HCL 50 MG PO TABS: 50.0000 mg | ORAL_TABLET | Freq: Four times a day (QID) | ORAL | 0 refills | Status: AC | PRN

## 2016-05-23 MED ORDER — MIDAZOLAM HCL 2 MG/2ML IJ SOLN
INTRAMUSCULAR | Status: AC
  Filled 2016-05-23: qty 2

## 2016-05-23 MED ORDER — KETOROLAC TROMETHAMINE 30 MG/ML IJ SOLN
INTRAMUSCULAR | Status: DC | PRN
  Administered 2016-05-23: 30 mg via INTRAVENOUS

## 2016-05-23 MED ORDER — DEXAMETHASONE SODIUM PHOSPHATE 10 MG/ML IJ SOLN
INTRAMUSCULAR | Status: AC
  Filled 2016-05-23: qty 1

## 2016-05-23 MED ORDER — FENTANYL CITRATE (PF) 100 MCG/2ML IJ SOLN
INTRAMUSCULAR | Status: DC | PRN
  Administered 2016-05-23: 25 ug via INTRAVENOUS
  Administered 2016-05-23: 50 ug via INTRAVENOUS
  Administered 2016-05-23: 25 ug via INTRAVENOUS
  Administered 2016-05-23 (×2): 50 ug via INTRAVENOUS

## 2016-05-23 MED ORDER — ONDANSETRON HCL 4 MG/2ML IJ SOLN
INTRAMUSCULAR | Status: DC | PRN
  Administered 2016-05-23: 4 mg via INTRAVENOUS

## 2016-05-23 MED ORDER — OXYCODONE HCL 5 MG PO TABS
5.0000 mg | ORAL_TABLET | Freq: Once | ORAL | Status: AC | PRN
  Administered 2016-05-23: 5 mg via ORAL
  Filled 2016-05-23: qty 1

## 2016-05-23 MED ORDER — BELLADONNA ALKALOIDS-OPIUM 16.2-60 MG RE SUPP
RECTAL | Status: AC
  Filled 2016-05-23: qty 1

## 2016-05-23 MED ORDER — DEXAMETHASONE SODIUM PHOSPHATE 4 MG/ML IJ SOLN
INTRAMUSCULAR | Status: DC | PRN
  Administered 2016-05-23: 10 mg via INTRAVENOUS

## 2016-05-23 MED ORDER — CEFAZOLIN SODIUM-DEXTROSE 2-4 GM/100ML-% IV SOLN
2.0000 g | INTRAVENOUS | Status: AC
  Administered 2016-05-23: 2 g via INTRAVENOUS
  Filled 2016-05-23: qty 100

## 2016-05-23 SURGICAL SUPPLY — 27 items
BAG DRAIN URO-CYSTO SKYTR STRL (DRAIN) IMPLANT
BASKET DAKOTA 1.9FR 11X120 (BASKET) IMPLANT
BASKET LASER NITINOL 1.9FR (BASKET) IMPLANT
BASKET STNLS GEMINI 4WIRE 3FR (BASKET) IMPLANT
BASKET ZERO TIP NITINOL 2.4FR (BASKET) ×4 IMPLANT
CATH URET 5FR 28IN OPEN ENDED (CATHETERS) ×4 IMPLANT
CATH URET DUAL LUMEN 6-10FR 50 (CATHETERS) IMPLANT
CLOTH BEACON ORANGE TIMEOUT ST (SAFETY) ×4 IMPLANT
FIBER LASER TRAC TIP (UROLOGICAL SUPPLIES) ×4 IMPLANT
GLOVE BIO SURGEON STRL SZ7.5 (GLOVE) ×4 IMPLANT
GOWN STRL REUS W/TWL XL LVL3 (GOWN DISPOSABLE) ×4 IMPLANT
GUIDEWIRE 0.038 PTFE COATED (WIRE) IMPLANT
GUIDEWIRE ANG ZIPWIRE 038X150 (WIRE) IMPLANT
GUIDEWIRE STR DUAL SENSOR (WIRE) ×8 IMPLANT
IV NS IRRIG 3000ML ARTHROMATIC (IV SOLUTION) ×4 IMPLANT
KIT BALLIN UROMAX 15FX10 (LABEL) IMPLANT
KIT BALLN UROMAX 15FX4 (MISCELLANEOUS) IMPLANT
KIT BALLN UROMAX 26 75X4 (MISCELLANEOUS)
KIT RM TURNOVER CYSTO AR (KITS) ×4 IMPLANT
MANIFOLD NEPTUNE II (INSTRUMENTS) ×4 IMPLANT
NS IRRIG 500ML POUR BTL (IV SOLUTION) IMPLANT
PACK CYSTO (CUSTOM PROCEDURE TRAY) ×4 IMPLANT
SET HIGH PRES BAL DIL (LABEL)
SHEATH ACCESS URETERAL 38CM (SHEATH) IMPLANT
STENT URET 6FRX26 CONTOUR (STENTS) ×4 IMPLANT
TUBE CONNECTING 12'X1/4 (SUCTIONS)
TUBE CONNECTING 12X1/4 (SUCTIONS) IMPLANT

## 2016-05-23 NOTE — Discharge Instructions (Addendum)
Remove your stent on Monday morning. Take the Cipro antibiotic at least 30 minutes before removing it. Take Tylenol and Ibuprofen for pain. Take Tramadol for breakthrough pain only. The pyridium will help with burning and frequency. It will turn your urine orange.  Contact [facility] for fever >101.5F by mouth, uncontrolled nausea or vomiting, pain uncontrolled by medication, decreased urine output, signs of wound infection (rapidly spreading redness/swelling, purulent discharge, increasing bleeding from wounds, or separation of wound); also call for any new or concerning symptoms.   You may shower, but do not immerse (take a bath or swim) your wounds for 2 weeks.  Wash the surgical site with mild soap and water, but do not scrub vigorously. Pat dry.   Do not drive while taking narcotic pain medications. Take Colace and/or Senna while taking narcotic pain medication. You may take over-the-counter Laxatives such as Miralax, Senna, or Milk of Magnesia. Stop taking these medications if you develop diarrhea.  Take all medications as prescribed.  Call the clinic to confirm your follow-up appointment if not already made.   Post Anesthesia Home Care Instructions  Activity: Get plenty of rest for the remainder of the day. A responsible individual must stay with you for 24 hours following the procedure.  For the next 24 hours, DO NOT: -Drive a car -Advertising copywriterperate machinery -Drink alcoholic beverages -Take any medication unless instructed by your physician -Make any legal decisions or sign important papers.  Meals: Start with liquid foods such as gelatin or soup. Progress to regular foods as tolerated. Avoid greasy, spicy, heavy foods. If nausea and/or vomiting occur, drink only clear liquids until the nausea and/or vomiting subsides. Call your physician if vomiting continues.  Special Instructions/Symptoms: Your throat may feel dry or sore from the anesthesia or the breathing tube placed in your  throat during surgery. If this causes discomfort, gargle with warm salt water. The discomfort should disappear within 24 hours.  If you had a scopolamine patch placed behind your ear for the management of post- operative nausea and/or vomiting:  1. The medication in the patch is effective for 72 hours, after which it should be removed.  Wrap patch in a tissue and discard in the trash. Wash hands thoroughly with soap and water. 2. You may remove the patch earlier than 72 hours if you experience unpleasant side effects which may include dry mouth, dizziness or visual disturbances. 3. Avoid touching the patch. Wash your hands with soap and water after contact with the patch.   [facility] [phone] Post Ureteroscopy With or Without Stent Instructions  Definitions:  Ureter: The duct that transports urine from the kidney to the bladder. Stent:   A plastic hollow tube that is placed into the ureter, from the kidney to the                 bladder to prevent the ureter from swelling shut.  GENERAL INSTRUCTIONS:  Despite the fact that no skin incisions were used, the area around the ureter and bladder is raw and irritated. The stent is a foreign body which will further irritate the bladder wall. This irritation is manifested by increased frequency of urination, both day and night, and by an increase in the urge to urinate. In some, the urge to urinate is present almost always. Sometimes the urge is strong enough that you may not be able to stop yourself from urinating. The only real cure is to remove the stent and then give time for the bladder  wall to heal which can't be done until the danger of the ureter swelling shut has passed, which varies.  You may see some blood in your urine while the stent is in place and a few days afterwards. Do not be alarmed, even if the urine was clear for a while. Get off your feet and drink lots of fluids until clearing occurs. If you start to pass  clots or don't improve, call us.  DIET: You may return to your normal diet immediately. Because of the raw surface of your bladder, alcohol, spicy foods, acid type foods and drinks with caffeine may cause irritation or frequency and should be used in moderation. To keep your urine flowing freely and to avoid constipation, drink plenty of fluids during the day ( 8-10 glasses ). Tip: Avoid cranberry juice because it is very acidic.  ACTIVITY: Your physical activity doesn't need to be restricted. However, if you are very active, you may see some blood in your urine. We suggest that you reduce your activity under these circumstances until the bleeding has stopped.  BOWELS: It is important to keep your bowels regular during the postoperative period. Straining with bowel movements can cause bleeding. A bowel movement every other day is reasonable. Use a mild laxative if needed, such as Milk of Magnesia 2-3 tablespoons, or 2 Dulcolax tablets. Call if you continue to have problems. If you have been taking narcotics for pain, before, during or after your surgery, you may be constipated. Take a laxative if necessary.   MEDICATION: You should resume your pre-surgery medications unless told not to. In addition you will often be given an antibiotic to prevent infection. These should be taken as prescribed until the bottles are finished unless you are having an unusual reaction to one of the drugs.  PROBLEMS YOU SHOULD REPORT TO [place]:  Fevers over 100.5 Fahrenheit.  Heavy bleeding, or clots ( See above notes about blood in urine ).  Inability to urinate.  Drug reactions ( hives, rash, nausea, vomiting, diarrhea ).  Severe burning or pain with urination that is not improving.  FOLLOW-UP: You will need a follow-up appointment to monitor your progress. Call for this appointment at the number listed above. Usually the first appointment will be about three to fourteen days after your surgery.

## 2016-05-23 NOTE — Anesthesia Preprocedure Evaluation (Addendum)
Anesthesia Evaluation  Patient identified by MRN, date of birth, ID band Patient awake    Reviewed: Allergy & Precautions, NPO status , Patient's Chart, lab work & pertinent test results  Airway Mallampati: II  TM Distance: >3 FB Neck ROM: Full    Dental no notable dental hx. (+) Teeth Intact, Dental Advisory Given, Caps,    Pulmonary neg pulmonary ROS, Current Smoker,    Pulmonary exam normal breath sounds clear to auscultation       Cardiovascular negative cardio ROS Normal cardiovascular exam Rhythm:Regular Rate:Normal     Neuro/Psych PSYCHIATRIC DISORDERS Anxiety Depression Bipolar Disorder    GI/Hepatic Neg liver ROS, GERD  Medicated and Controlled,  Endo/Other  negative endocrine ROS  Renal/GU    Left renal calculus    Musculoskeletal negative musculoskeletal ROS (+)   Abdominal   Peds  Hematology negative hematology ROS (+)   Anesthesia Other Findings   Reproductive/Obstetrics negative OB ROS                        Anesthesia Physical Anesthesia Plan  ASA: II  Anesthesia Plan: General   Post-op Pain Management:    Induction: Intravenous  Airway Management Planned: LMA  Additional Equipment:   Intra-op Plan:   Post-operative Plan: Extubation in OR  Informed Consent: I have reviewed the patients History and Physical, chart, labs and discussed the procedure including the risks, benefits and alternatives for the proposed anesthesia with the patient or authorized representative who has indicated his/her understanding and acceptance.   Dental advisory given  Plan Discussed with: CRNA  Anesthesia Plan Comments:         Anesthesia Quick Evaluation

## 2016-05-23 NOTE — Interval H&P Note (Signed)
History and Physical Interval Note:  05/23/2016 7:46 AM  Don Pham  has presented today for surgery, with the diagnosis of left ureteral  stone  The various methods of treatment have been discussed with the patient and family. After consideration of risks, benefits and other options for treatment, the patient has consented to  Procedure(s): CYSTOSCOPY WITH LEFT  RETROGRADE PYELOGRAM LEFT, URETEROSCOPY HOLMIUM LASER LITHOTRISPY AND STENT PLACEMENT (Left) HOLMIUM LASER APPLICATION (Left) as a surgical intervention .  The patient's history has been reviewed, patient examined, no change in status, stable for surgery.  I have reviewed the patient's chart and labs.  Questions were answered to the patient's satisfaction.     Berniece SalinesHERRICK, Kimberly Coye W

## 2016-05-23 NOTE — Anesthesia Procedure Notes (Signed)
Procedure Name: LMA Insertion Date/Time: 05/23/2016 8:34 AM Performed by: Tyrone NineSAUVE, Jakarie Pember F Pre-anesthesia Checklist: Patient identified, Timeout performed, Emergency Drugs available, Suction available and Patient being monitored Patient Re-evaluated:Patient Re-evaluated prior to inductionOxygen Delivery Method: Circle system utilized Preoxygenation: Pre-oxygenation with 100% oxygen Intubation Type: IV induction Ventilation: Mask ventilation without difficulty LMA: LMA inserted LMA Size: 4.0 Number of attempts: 1 Placement Confirmation: positive ETCO2 and breath sounds checked- equal and bilateral Tube secured with: Tape Dental Injury: Teeth and Oropharynx as per pre-operative assessment

## 2016-05-23 NOTE — Anesthesia Postprocedure Evaluation (Signed)
Anesthesia Post Note  Patient: Don Pham  Procedure(s) Performed: Procedure(s) (LRB): CYSTOSCOPY WITH lEFT, URETEROSCOPY HOLMIUM LASER LITHOTRISPY AND STENT PLACEMENT (Left) HOLMIUM LASER APPLICATION (Left)  Patient location during evaluation: PACU Anesthesia Type: General Level of consciousness: sedated and patient cooperative Pain management: pain level controlled Vital Signs Assessment: post-procedure vital signs reviewed and stable Respiratory status: spontaneous breathing Cardiovascular status: stable Anesthetic complications: no       Last Vitals:  Vitals:   05/23/16 1000 05/23/16 1108  BP: 115/74 122/77  Pulse: 66 71  Resp: 13 16  Temp:  36.7 C    Last Pain:  Vitals:   05/23/16 1108  TempSrc:   PainSc: 2                  Lewie LoronJohn Kersti Scavone

## 2016-05-23 NOTE — H&P (Signed)
CC/HPI: CC - Follow up ER visit for 5mm stone   41 year old otherwise healthy male who presented to the ER on April 28, 2016 with left renal colic and was found to have a 5 mm left proximal ureteral calculus with hydronephrosis. He was stable for discharge. His creatinine was 1.1 and he did not have leukocytosis or any signs of infection of his urinalysis. He returned to the ER 3 days later on April 24 as he ran out of pain medications. At that visit his creatinine had bone to 2.1, he had leukocytosis to 14 although no signs of infection on a repeat urinalysis. A repeat CT scan of the abdomen and pelvis was performed showing a stable stone similar position with relatively stable hydronephrosis on the left. He has been on medical expulsive therapy. He was also discharged on ketorolac as well as Percocet.   650HU. 5 x 5mm stone.   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 Overall he thinks his pain is managed with Percocet and Toradol. He has had constipation with Percocet requiring stool softeners. He tried some laxatives but had severe abdominal cramping and switched to enemas today. He's had poor appetite as well as poor sleep habits as last Saturday his had a twinge of nausea over the last few days but no emesis since his initial ER visit. He is doing an adequate job of hydrating now although surprisingly he was told by nursing staff on his first visit not to drink much at all. He is noticed urinary frequency and urgency but no gross hematuria or dysuria. He does not have a fever here today and has not had any fevers at home. No personal history of stones, strong family history     ALLERGIES: No Allergies    MEDICATIONS: Ketorolac Tromethamine 10 mg tablet  Percocet 5 mg-325 mg tablet  Anti-Nausea  Flomax  Gabapentin 300 mg capsule  Seroquel 100 mg tablet  Wellbutrin Xl 150 mg tablet, extended release 24 hr     GU PSH: None   NON-GU PSH: Appendectomy (open)     GU PMH: None   NON-GU PMH: Anxiety Depression    FAMILY HISTORY: 2 daughters - Other Cancer - Mother   SOCIAL HISTORY: Marital Status: Single Current Smoking Status: Patient smokes.   Tobacco Use Assessment Completed: Used Tobacco in last 30 days? Drinks 2 caffeinated drinks per day.    REVIEW OF SYSTEMS:    GU Review Male:   Patient reports erection problems. Patient denies have to strain to urinate , get up at night to urinate, frequent urination, trouble starting your stream, leakage of urine, burning/ pain with urination, hard to postpone urination, penile pain, and stream starts and stops.  Gastrointestinal (Upper):   Patient reports nausea. Patient denies vomiting and indigestion/ heartburn.  Gastrointestinal (Lower):   Patient reports constipation. Patient denies diarrhea.  Constitutional:   Patient reports fatigue. Patient denies fever, night sweats, and weight loss.  Skin:   Patient denies skin rash/ lesion and itching.  Eyes:   Patient denies blurred vision and double vision.  Ears/ Nose/ Throat:   Patient denies sore throat and sinus problems.  Hematologic/Lymphatic:   Patient denies swollen glands and easy bruising.  Cardiovascular:   Patient denies leg swelling and chest pains.  Respiratory:   Patient reports shortness of breath. Patient denies cough.  Endocrine:   Patient denies excessive thirst.  Musculoskeletal:   Patient reports back pain. Patient denies joint pain.  Neurological:   Patient denies headaches and dizziness.  Psychologic:   Patient reports depression and anxiety.    VITAL SIGNS:      05/03/2016 03:00 PM  Weight 150 lb / 68.04 kg  Height 72 in / 182.88 cm  BP 111/67 mmHg  Heart Rate 97 /min  Temperature 99.1 F / 37 C  BMI 20.3 kg/m   GU PHYSICAL EXAMINATION:      Notes: + left CVAT   MULTI-SYSTEM PHYSICAL EXAMINATION:    Constitutional: Well-nourished. No physical deformities. Normally developed. Good grooming.  Neck: Neck  symmetrical, not swollen. Normal tracheal position.  Respiratory: No labored breathing, no use of accessory muscles.   Cardiovascular: Normal temperature, normal extremity pulses, no swelling, no varicosities.  Lymphatic: No enlargement of neck, axillae, groin.  Skin: No paleness, no jaundice, no cyanosis. No lesion, no ulcer, no rash.  Neurologic / Psychiatric: Oriented to time, oriented to place, oriented to person. No depression, no anxiety, no agitation.  Gastrointestinal: No mass, no tenderness, no rigidity, non obese abdomen.  Eyes: Normal conjunctivae. Normal eyelids.  Ears, Nose, Mouth, and Throat: Left ear no scars, no lesions, no masses. Right ear no scars, no lesions, no masses. Nose no scars, no lesions, no masses. Normal hearing. Normal lips.  Musculoskeletal: Normal gait and station of head and neck.     PAST DATA REVIEWED:  Source Of History:  Patient  Lab Test Review:   BMP, CBC  Records Review:   Previous Hospital Records  Urine Test Review:   Urinalysis  X-Ray Review: C.T. Abdomen/Pelvis: Reviewed Films.     PROCEDURES:          Urinalysis Dipstick Dipstick Cont'd  Color: Yellow Bilirubin: Neg  Appearance: Clear Ketones: Trace  Specific Gravity: 1.025 Blood: Neg  pH: 5.5 Protein: Neg  Glucose: Neg Urobilinogen: 0.2    Nitrites: Neg    Leukocyte Esterase: Neg    ASSESSMENT:      ICD-10 Details  1 GU:   Ureteral calculus - N20.1           Notes:   41 year old male with left obstructing 5mm proximal ureteral calculus with hydronephrosis.    PLAN:            Medications New Meds: Oxycodone Hcl 5 mg tablet 1 tablet PO Q 4 H PRN   #30  0 Refill(s)            Orders Labs Urine Culture          Schedule Return Visit/Planned Activity: ASAP - Schedule Surgery          Document Letter(s):  Created for Patient: Clinical Summary         Notes:   Schedule next available cystoscopy, left USE, left RPG and stent placement with laser litho.   Continue  flomax 0.4mg  qd. Patient knows to save stone if he passes it and call us to cancel surgery.   Do a better job of hydrating. Advised patient to avoid toradol and ibuprofen until drinking well and not to double up on those meds. New Rx for oxycodone 5 x 30 pills given with instructions to cycle oxycodone, tylenol, and NSAID (if he is hydrating well).   Risks/benefits of surgery discussed in completion, patient eager to proceed with surgery.   Urine culture today.

## 2016-05-23 NOTE — Transfer of Care (Signed)
Immediate Anesthesia Transfer of Care Note  Patient: Morey HummingbirdJustin A Darrington  Procedure(s) Performed: Procedure(s): CYSTOSCOPY WITH LEFT  RETROGRADE PYELOGRAM LEFT, URETEROSCOPY HOLMIUM LASER LITHOTRISPY AND STENT PLACEMENT (Left) HOLMIUM LASER APPLICATION (Left)  Patient Location: PACU  Anesthesia Type:General  Level of Consciousness: awake, alert , oriented and patient cooperative  Airway & Oxygen Therapy: Patient Spontanous Breathing and Patient connected to nasal cannula oxygen  Post-op Assessment: Report given to RN and Post -op Vital signs reviewed and stable  Post vital signs: Reviewed and stable  Last Vitals:  Vitals:   05/23/16 0700 05/23/16 0925  BP: 113/66 116/69  Pulse: 80 77  Resp: 16 13  Temp: 36.9 C 36.6 C    Last Pain:  Vitals:   05/23/16 0700  TempSrc: Oral      Patients Stated Pain Goal: 5 (05/23/16 0718)  Complications: No apparent anesthesia complications

## 2016-05-23 NOTE — Op Note (Signed)
Preoperative Diagnosis: Left ureteral stone  Postoperative Diagnosis:  Same  Procedure(s) Performed:   1. Cystourethroscopy 2. Left ureteroscopic stone extraction using laser lithotripsy 3. Left ureteral stent placement, 6 Fr x 26 cm double J type, string attached 4. Intraoperative fluoroscopy with interpretation <1hr  Surgeons:   Primary: Crist FatHerrick, Benjamin W, MD  Resident: Judie GrieveLomboy  Anesthesia:  General via LMA Anesthesiologist: Lewie LoronGermeroth, John, MD CRNA: Tyrone NineSauve, Robin F, CRNA   Fluids:  See anesthesia record  Estimated blood loss:  Minimal  Specimens:  Stone for analysis  Cultures:  None  Drains:  None  Implant(s): Percuflex 6 Fr x 26 cm double J type ureteral stent, left ureter, string attached  Complications:  None  Indications: 41 y.o. year-old male with history of 5mm mid left ureteral stone. He was previously seen in clinic where he provided written informed consent for the procedure after discussion of the risks, benefits, and alternatives. All questions answered. He is eager to proceed.  Findings:  5mm mid left ureteral stone, calcium oxalate monohydrate appearing. Successful extraction. Kidney not surveyed for stone. Narrow UVJ with moderate amount of inflammation at end of case.  Description:  The patient was correctly identified in the preop holding area where written informed consent as well potential risk and complication reviewed. He agreed. They were brought back to the operative suite where a preinduction timeout was performed. Once correct information was verified, anesthesia was induced  via LMA. They were then gently placed into dorsal lithotomy position with SCDs in place for VTE prophylaxis. They were prepped and draped in the usual sterile fashion and given appropriate preoperative antibiotics with Ancef. A second timeout was then performed.   We inserted a 60F rigid cystoscope per urethra with copious lubrication and normal saline irrigation running. This  demonstrated findings as described above. We advanced a Sensor wire into the left ureteral orifice and advanced this up to the kidney under fluoroscopic guidance. We then switched to our semirigid ureteroscope. The left UVJ and distal ureter were narrow, requiring second wire placement. We approximated the stone described above. We brought in a 200 micron laser fiber to perform lithotripsy. We then used a ZeroTip basket to remove all fragments. We then removed the ureteroscope leaving one wire in place. Under direct vision, we advanced a 6 Fr x 26 cm double J type ureteral stent with string attached up to the left kidney. Removal of the wire demonstrated appropriate curls proximally and distally. The bladder was emptied. The stone was sent for analysis. The string was visible at the end of the case. The patient was awoken from anesthesia and taken to PACU in stable condition.   Post Op Plan:   1. Discharge patient home when meets PACU criteria. Patient must void prior to discharge. 2. Removal ureteral stent on Monday.  3. Follow-up [facility] clinic in 6 weeks for renal ultrasound, discussion of stone analysis and stone prevention.

## 2016-05-24 ENCOUNTER — Encounter (HOSPITAL_BASED_OUTPATIENT_CLINIC_OR_DEPARTMENT_OTHER): Payer: Self-pay | Admitting: Urology

## 2018-10-03 IMAGING — CT CT RENAL STONE PROTOCOL
2 of 3 series · 15 of 46 positions shown, 17 images · non-contrast
Comparison: Abdominal CT dated 04/28/2016

CLINICAL DATA: 40-year-old male with left-sided flank pain.

EXAM:
CT ABDOMEN AND PELVIS WITHOUT CONTRAST
TECHNIQUE: Multidetector CT imaging of the abdomen and pelvis was performed
following the standard protocol without IV contrast.

[Series 4: lung · axial · 0.67mm/px · z∈[-123,-57]mm · 12 of 39 slices shown, 14 images]
[im 3/39  soft-tissue]
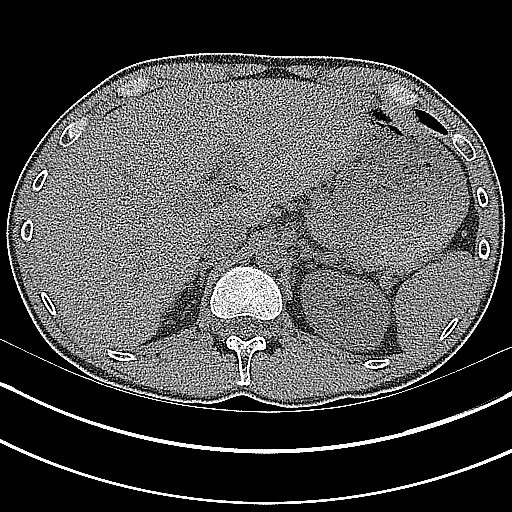
[im 3/39  bone]
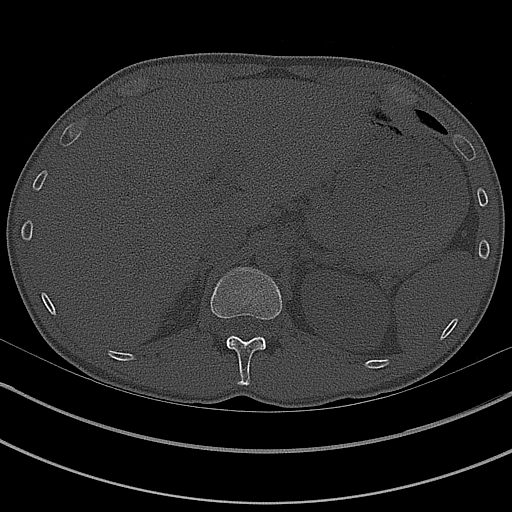
[im 5/39  soft-tissue]
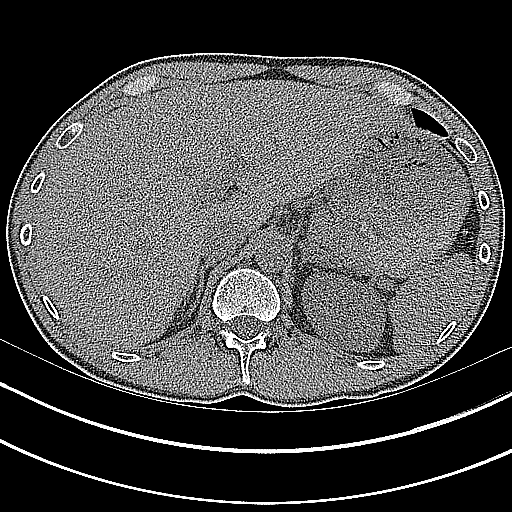
[im 9/39  soft-tissue]
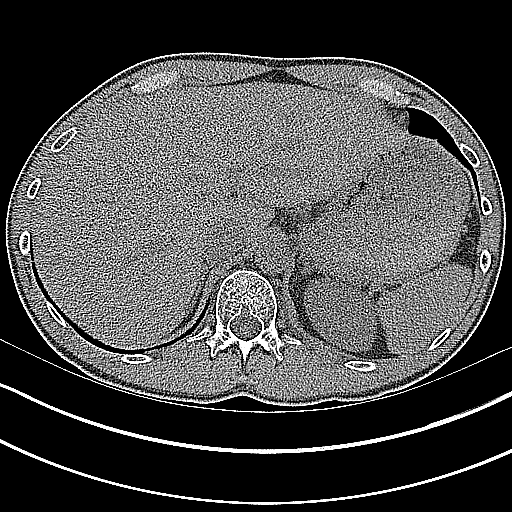
[im 12/39  soft-tissue]
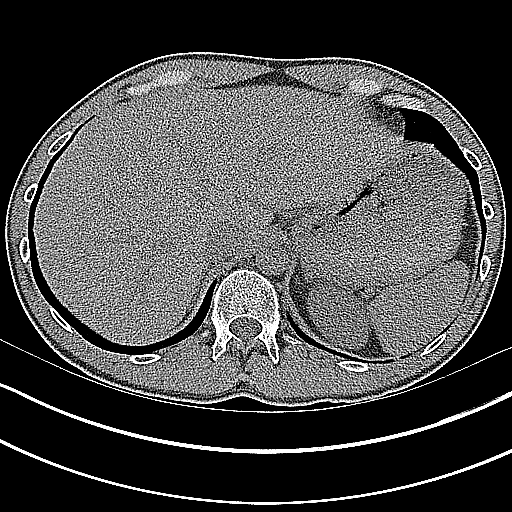
[im 15/39  soft-tissue]
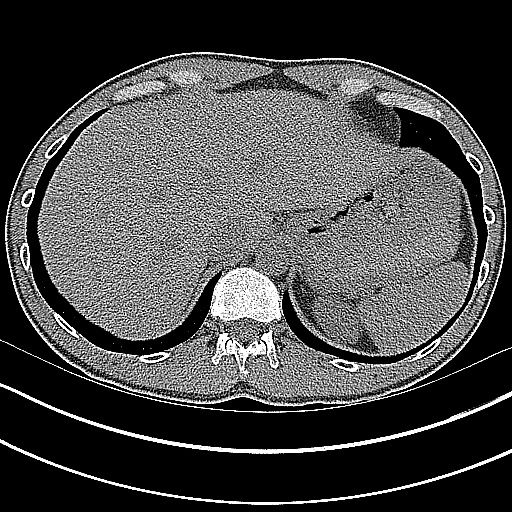
[im 18/39  soft-tissue]
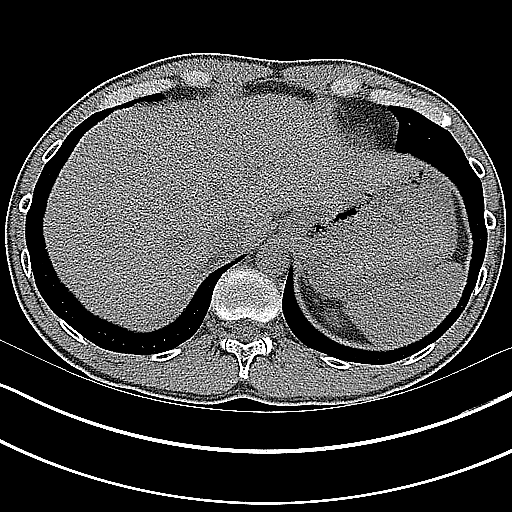
[im 21/39  soft-tissue]
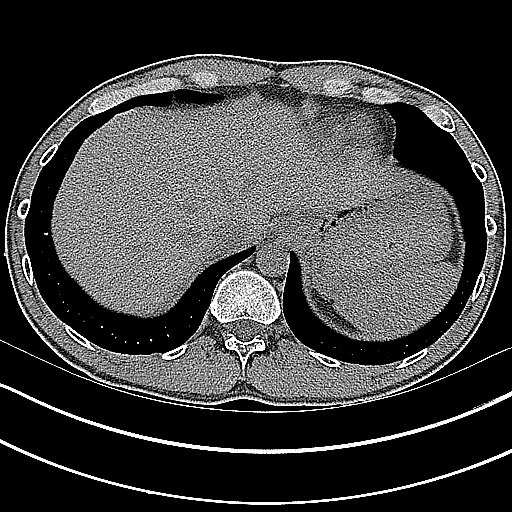
[im 24/39  soft-tissue]
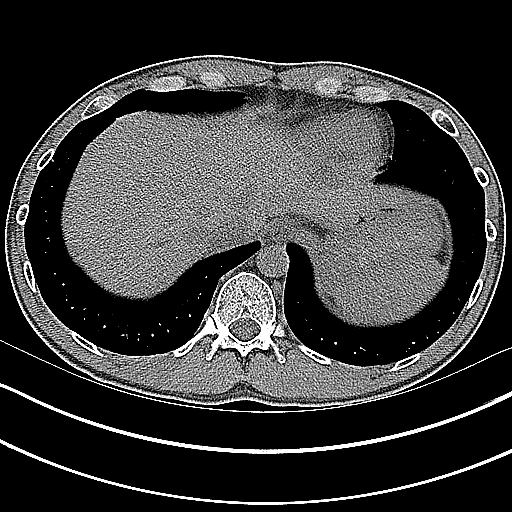
[im 27/39  soft-tissue]
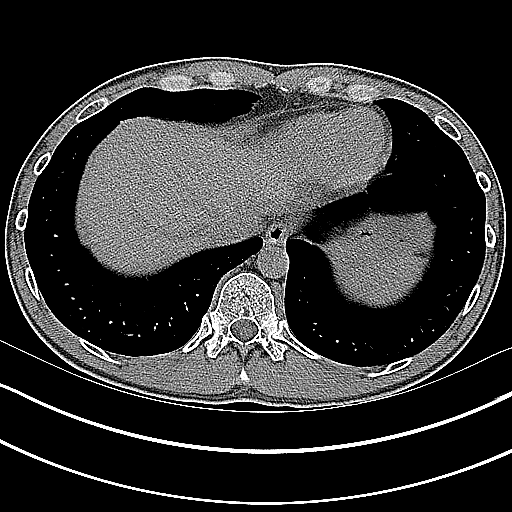
[im 27/39  bone]
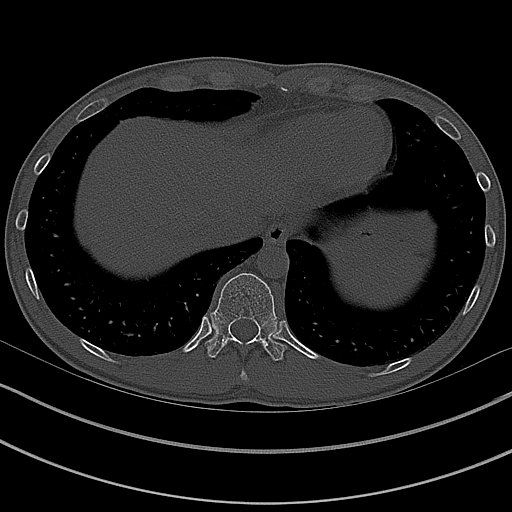
[im 30/39  soft-tissue]
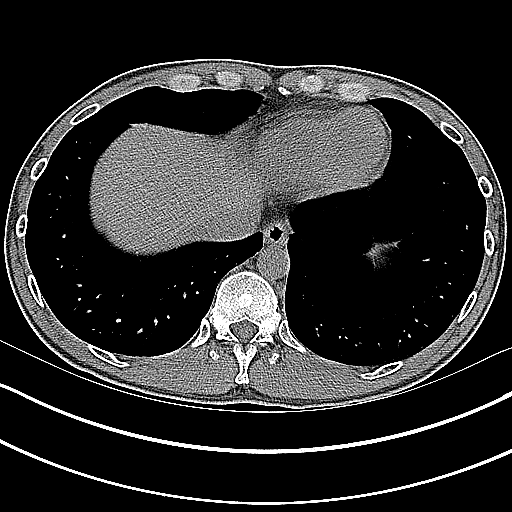
[im 34/39  soft-tissue]
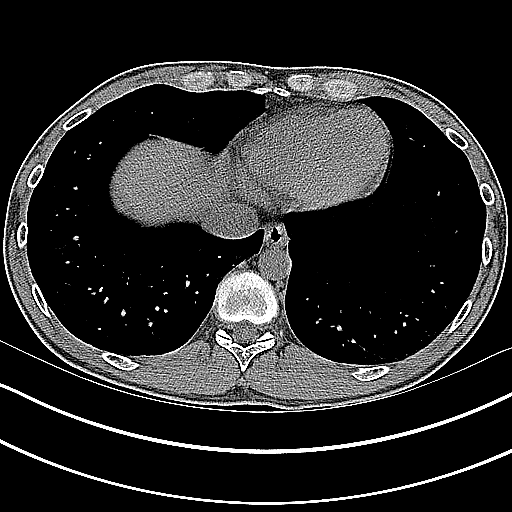
[im 36/39  soft-tissue]
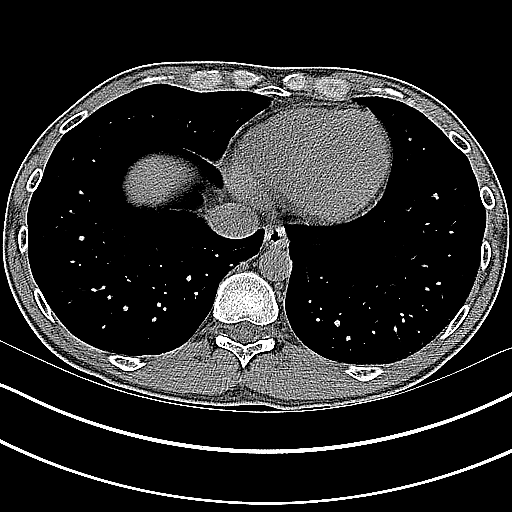

[Series 5: coronal · coronal · 0.67mm/px · 3 of 121 slices shown]
[im 41/121  soft-tissue]
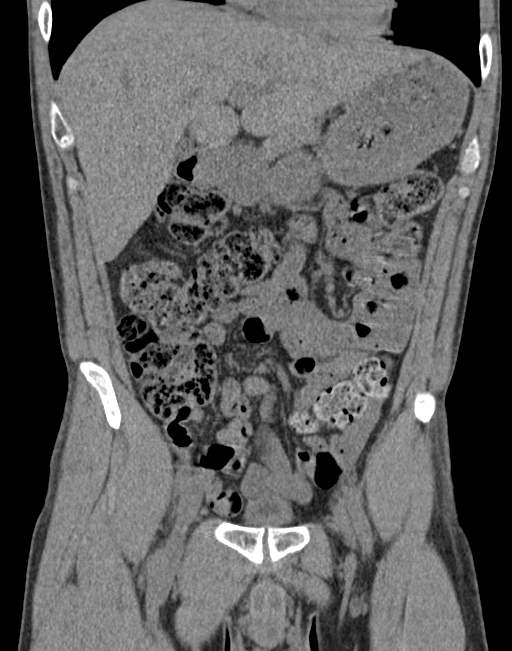
[im 54/121  soft-tissue]
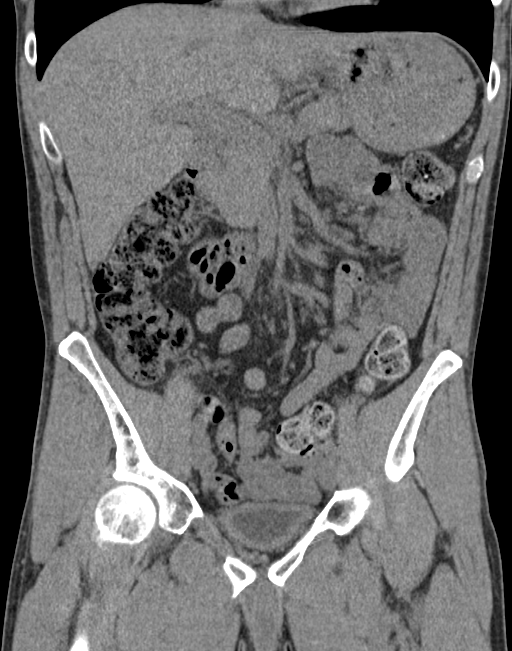
[im 67/121  soft-tissue]
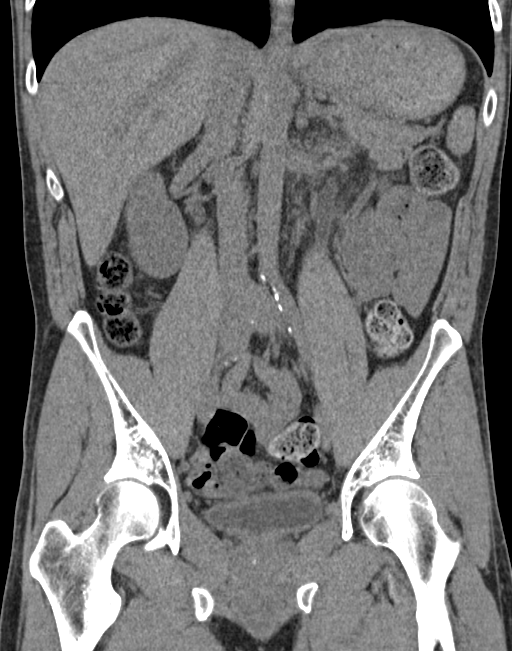

[15 of 46 positions shown; findings below may reference images not displayed]

FINDINGS: Evaluation of this exam is limited in the absence of intravenous
contrast.

Lower chest: The visualized lung bases are clear.

No intra-abdominal free air or free fluid.

Hepatobiliary: No focal liver abnormality is seen. No gallstones,
gallbladder wall thickening, or biliary dilatation.

Pancreas: Unremarkable. No pancreatic ductal dilatation or
surrounding inflammatory changes.

Spleen: Normal in size without focal abnormality.

Adrenals/Urinary Tract: The adrenal glands are unremarkable. There
is a 5 mm stone in the mid left ureter with mild left
hydronephrosis. There is no significant interval change in the
location of the stone compared to the prior CT. Stable or minimally
increased hydronephrosis compared to the prior CT. The no other
stone identified. The right kidney is unremarkable. The urinary
bladder is partially distended appears unremarkable.

Stomach/Bowel: There is moderate amount of stool throughout the
colon. No evidence of bowel obstruction or active inflammation. The
appendix is not identified with certainty. A small tubular structure
inferior to the cecum may represent a normal appendix. There is no
inflammatory changes or secondary signs of acute appendicitis.

Vascular/Lymphatic: There is mild aortoiliac atherosclerotic
disease. The abdominal aorta and IVC are otherwise grossly
unremarkable on this noncontrast study. No portal venous gas
identified. There is no adenopathy.

Reproductive: The prostate and seminal vesicles are grossly
unremarkable.

Other: No abdominal wall hernia or abnormality. No abdominopelvic
ascites.None

Musculoskeletal: No acute or significant osseous findings.
IMPRESSION: 1. A 5 mm stone in the left mid ureter with mild left
hydronephrosis. No interval change in the location of the stone
since the prior CT. Stable or minimally increased left
hydronephrosis. Correlation with urinalysis recommended to exclude
superimposed UTI. No other acute intra-abdominopelvic pathology
identified.
2. Moderate colonic stool burden. No evidence of bowel obstruction
or active inflammation.
3. Aortic atherosclerosis.
